# Patient Record
Sex: Male | Born: 1990 | Race: Black or African American | Hispanic: No | Marital: Single | Smoking: Former smoker
Health system: Southern US, Community
[De-identification: ages and names within clinical notes are randomized; demographics above are authoritative.]

---
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
## 2015-03-10 ENCOUNTER — Encounter: Payer: Self-pay | Admitting: Emergency Medicine

## 2015-03-10 ENCOUNTER — Emergency Department: Payer: Self-pay

## 2015-03-10 ENCOUNTER — Emergency Department
Admission: EM | Admit: 2015-03-10 | Discharge: 2015-03-10 | Disposition: A | Discharge status: Home / Self Care | Payer: Self-pay | Attending: Emergency Medicine | Admitting: Emergency Medicine

## 2015-03-10 DIAGNOSIS — F17210 Nicotine dependence, cigarettes, uncomplicated: Secondary | ICD-10-CM | POA: Insufficient documentation

## 2015-03-10 DIAGNOSIS — R002 Palpitations: Secondary | ICD-10-CM

## 2015-03-10 DIAGNOSIS — F1210 Cannabis abuse, uncomplicated: Secondary | ICD-10-CM | POA: Insufficient documentation

## 2015-03-10 DIAGNOSIS — F1010 Alcohol abuse, uncomplicated: Secondary | ICD-10-CM | POA: Insufficient documentation

## 2015-03-10 LAB — URINALYSIS COMPLETE WITH MICROSCOPIC (ARMC ONLY)
BACTERIA UA: NONE SEEN
Bilirubin Urine: NEGATIVE
Glucose, UA: NEGATIVE mg/dL
Hgb urine dipstick: NEGATIVE
Leukocytes, UA: NEGATIVE
Nitrite: NEGATIVE
PROTEIN: NEGATIVE mg/dL
Specific Gravity, Urine: 1.023 (ref 1.005–1.030)
Squamous Epithelial / LPF: NONE SEEN
pH: 6 (ref 5.0–8.0)

## 2015-03-10 LAB — URINE DRUG SCREEN, QUALITATIVE (ARMC ONLY)
Amphetamines, Ur Screen: NOT DETECTED
Barbiturates, Ur Screen: NOT DETECTED
Benzodiazepine, Ur Scrn: NOT DETECTED
CANNABINOID 50 NG, UR ~~LOC~~: POSITIVE — AB
Cocaine Metabolite,Ur ~~LOC~~: NOT DETECTED
MDMA (ECSTASY) UR SCREEN: NOT DETECTED
Methadone Scn, Ur: NOT DETECTED
Opiate, Ur Screen: NOT DETECTED
PHENCYCLIDINE (PCP) UR S: NOT DETECTED
Tricyclic, Ur Screen: NOT DETECTED

## 2015-03-10 LAB — CBC
HCT: 42.2 % (ref 40.0–52.0)
HEMOGLOBIN: 14.3 g/dL (ref 13.0–18.0)
MCH: 29.9 pg (ref 26.0–34.0)
MCHC: 33.8 g/dL (ref 32.0–36.0)
MCV: 88.3 fL (ref 80.0–100.0)
PLATELETS: 199 10*3/uL (ref 150–440)
RBC: 4.78 MIL/uL (ref 4.40–5.90)
RDW: 14.4 % (ref 11.5–14.5)
WBC: 10.7 10*3/uL — ABNORMAL HIGH (ref 3.8–10.6)

## 2015-03-10 LAB — BASIC METABOLIC PANEL
Anion gap: 9 (ref 5–15)
BUN: 15 mg/dL (ref 6–20)
CALCIUM: 9.4 mg/dL (ref 8.9–10.3)
CO2: 27 mmol/L (ref 22–32)
CREATININE: 1.34 mg/dL — AB (ref 0.61–1.24)
Chloride: 106 mmol/L (ref 101–111)
GFR calc Af Amer: >60 mL/min (ref >60)
GFR calc non Af Amer: >60 mL/min (ref >60)
GLUCOSE: 98 mg/dL (ref 65–99)
Potassium: 3.5 mmol/L (ref 3.5–5.1)
Sodium: 142 mmol/L (ref 135–145)

## 2015-03-10 LAB — TROPONIN I

## 2015-03-10 MED ORDER — SODIUM CHLORIDE 0.9 % IV BOLUS (SEPSIS)
1000.0000 mL | Freq: Once | INTRAVENOUS | Status: AC
  Administered 2015-03-10: 1000 mL via INTRAVENOUS

## 2015-03-10 MED ORDER — LORAZEPAM 2 MG/ML IJ SOLN
1.0000 mg | Freq: Once | INTRAMUSCULAR | Status: AC
Start: 2015-03-10 — End: 2015-03-10
  Administered 2015-03-10: 1 mg via INTRAVENOUS
  Filled 2015-03-10: qty 1

## 2015-03-10 NOTE — ED Provider Notes (Signed)
[facility] Emergency Department Provider Note  REMINDER - THIS NOTE IS NOT A FINAL MEDICAL RECORD UNTIL IT IS SIGNED. UNTIL THEN, THE CONTENT BELOW MAY REFLECT INFORMATION FROM A DOCUMENTATION TEMPLATE, NOT THE ACTUAL PATIENT VISIT. ____________________________________________  Time seen: Approximately 4:02 AM  I have reviewed the triage vital signs and the nursing notes.   HISTORY  Chief Complaint Palpitations    HPI Victor Maldonado is a 24 y.o. male presents to the ER for evaluation of palpitation.  Patient reports that for about the last 2-3 weeks he is been experiencing episodes where he feels like his heart will beat hard in his chest at times. He also reports that he's been using a fair amount of marijuana daily for the last 2 weeks, and will occasionally drink a pint of alcohol which she did drink this evening as well as smokes marijuana. He denies any other drug use.  He denies fevers or chills. No chest pain or trouble breathing. He has not passed out, he does not feel weak. He does report that he feels a little bit "shaky" at times. Denies a history of withdrawals.  No nausea or vomiting. No headache. No trouble breathing, though he does report that occasionally he coughs especially with smoking cigarettes.  No history of alcohol withdrawals. He reports that he usually goes and drinks about once a day, and he is not developed the shakes during the day however he does feel shaky tonight.  History reviewed. No pertinent past medical history.  There are no active problems to display for this patient.   History reviewed. No pertinent past surgical history.  No current outpatient prescriptions on file.  Allergies Review of patient's allergies indicates no known allergies.  No family history on file.  Social History Social History  Substance Use Topics  . Smoking status: Current Every Day Smoker -- 0.50 packs/day    Types: Cigarettes  .  Smokeless tobacco: None  . Alcohol Use: Yes    Review of Systems Constitutional: No fever/chills Eyes: No visual changes. ENT: No sore throat. Cardiovascular: Denies chest pain. Respiratory: Denies shortness of breath. Occasional dry cough, especially after smoking. Gastrointestinal: No abdominal pain.  No nausea, no vomiting.  No diarrhea.  No constipation. Genitourinary: Negative for dysuria. Musculoskeletal: Negative for back pain. Skin: Negative for rash. Neurological: Negative for headaches, focal weakness or numbness. Feels slightly tremulous at times. Denies hallucinations, paranoia. No desire to harm himself or anyone else.   10-point ROS otherwise negative.  ____________________________________________   PHYSICAL EXAM:  VITAL SIGNS: ED Triage Vitals  Enc Vitals Group     BP 03/10/15 0257 161/88 mmHg     Pulse Rate 03/10/15 0257 91     Resp 03/10/15 0257 24     Temp 03/10/15 0257 98.4 F (36.9 C)     Temp Source 03/10/15 0257 Oral     SpO2 03/10/15 0257 99 %     Weight 03/10/15 0257 155 lb (70.308 kg)     Height 03/10/15 0257  (1.93 m)     Head Cir --      Peak Flow --      Pain Score --      Pain Loc --      Pain Edu? --      Excl. in GC? --    Constitutional: Alert and oriented. Well appearing and in no acute distress. Patient reports he feels slightly anxious. Eyes: Conjunctivae are normal. PERRL. EOMI. Head: Atraumatic. Nose:  No congestion/rhinnorhea. Mouth/Throat: Mucous membranes are moist.  Oropharynx non-erythematous. Neck: No stridor.   Cardiovascular: Normal rate, regular rhythm. Grossly normal heart sounds.  Good peripheral circulation. Respiratory: Normal respiratory effort.  No retractions. Lungs CTAB. Gastrointestinal: Soft and nontender. No distention. No abdominal bruits. No CVA tenderness. Musculoskeletal: No lower extremity tenderness nor edema.  No joint effusions. Neurologic:  Normal speech and language. No gross focal neurologic  deficits are appreciated. No gait instability. Skin:  Skin is warm, dry and intact. No rash noted. Psychiatric: Mood and affect are slightly anxious. Speech and behavior are normal.  ____________________________________________   LABS (all labs ordered are listed, but only abnormal results are displayed)  Labs Reviewed  BASIC METABOLIC PANEL - Abnormal; Notable for the following:    Creatinine, Ser 1.34 (*)    All other components within normal limits  CBC - Abnormal; Notable for the following:    WBC 10.7 (*)    All other components within normal limits  URINE DRUG SCREEN, QUALITATIVE ([facility] ONLY) - Abnormal; Notable for the following:    Cannabinoid 50 Ng, Ur [place] POSITIVE (*)    All other components within normal limits  URINALYSIS COMPLETEWITH MICROSCOPIC ([facility] ONLY) - Abnormal; Notable for the following:    Color, Urine YELLOW (*)    APPearance CLEAR (*)    Ketones, ur TRACE (*)    All other components within normal limits  TROPONIN I  ETHANOL   ____________________________________________  EKG  ED ECG REPORT I, QUALE, MARK, the attending physician, personally viewed and interpreted this ECG.  Date: 03/10/2015 EKG Time: 255 Rate: 90 Rhythm: normal sinus rhythm QRS Axis: normal Intervals: normal ST/T Wave abnormalities: normal Conduction Disutrbances: Slight right axis deviation Narrative Interpretation: Right axis Normal QTC, no Brugada, no WPW, no evidence of ischemia ____________________________________________  RADIOLOGY  DG Chest 2 View (Final result) Result time: 03/10/15 03:36:38   Final result by Rad Results In Interface (03/10/15 03:36:38)   Narrative:   CLINICAL DATA: Lightheaded. Rapid heart beat. Chest pain for 3 weeks. Smoker.  EXAM: CHEST 2 VIEW  COMPARISON: None.  FINDINGS: The heart size and mediastinal contours are within normal limits. Both lungs are clear. The visualized skeletal structures are unremarkable.  IMPRESSION: No  active cardiopulmonary disease.    ____________________________________________   PROCEDURES  Procedure(s) performed: None  Critical Care performed: No  ____________________________________________   INITIAL IMPRESSION / ASSESSMENT AND PLAN / ED COURSE  Pertinent labs & imaging results that were available during my care of the patient were reviewed by me and considered in my medical decision making (see chart for details).  Patient presents for evaluation of palpitations. This is in the setting of using marijuana and drinking alcohol tonight, and he has no acute cardiopulmonary symptoms other than occasional dry cough. He reports to me that he is eating lightly today, he drank fairly heavily this evening, and also use marijuana. He does appear slightly anxious.  EKG is reassuring. No chest pain. No syncope. Discussed with the patient, and will obtain labs to evaluate a left relates the setting of his alcohol use, also check his hemoglobin, and a chest x-ray. We will observe him, and we'll provide a small dose of Ativan as he does appear to be anxious. He denies a history of heavy regular alcohol use, he has no history of prior withdrawals or seizures. He demonstrates no hyperreflexia or signs of alcohol withdrawal at this time. His aching and thought processes are clear.  We will hydrate him and  his creatinine is slightly elevated, GFR is normal. In addition provide Ativan and observe him closely. I had a frank discussion with him about alcohol use, and heavy drinking along with regular marijuana use and some of the risks of this. He is agreeable to decrease alcohol use, and also plans to stop smoking cigarettes and decreased marijuana use. He is not driving tonight and does not drive or use alcohol or marijuana.  ----------------------------------------- 5:49 AM on 03/10/2015 -----------------------------------------  Patient resting comfortably. He awakens to voice, sits up reports  that all symptoms have gone away. Discussed careful return precautions, advised follow-up with [facility] regarding substance and treatment. He is not driving himself home. Alert, no distress.  Discussed careful return precautions. ____________________________________________   FINAL CLINICAL IMPRESSION(S) / ED DIAGNOSES  Final diagnoses:  Mild tetrahydrocannabinol (THC) abuse  Alcohol abuse  Palpitation      Sharyn CreamerMark Quale, MD 03/10/15 [phone]

## 2015-03-10 NOTE — Discharge Instructions (Signed)
Cannabis Use Disorder Cannabis use disorder is a mental disorder. It is not one-time or occasional use of cannabis, more commonly known as marijuana. Cannabis use disorder is the continued, nonmedical use of cannabis that interferes with normal life activities or causes health problems. People with cannabis use disorder get a feeling of extreme pleasure and relaxation from cannabis use. This "high" is very rewarding and causes people to use over and over.  The mind-altering ingredient in cannabis is know as THC. THC can also interfere with motor coordination, memory, judgment, and accurate sense of space and time. These effects can last for a few days after using cannabis. Regular heavy cannabis use can cause long-lasting problems with thinking and learning. In young people, these problems may be permanent. Cannabis sometimes causes severe anxiety, paranoia, or visual hallucinations. Man-made (synthetic) cannabis-like drugs, such as "spice" and "K2," cause the same effects as THC but are much stronger. Cannabis-like drugs can cause dangerously high blood pressure and heart rate.  Cannabis use disorder usually starts in the teenage years. It can trigger the development of schizophrenia. It is somewhat more common in men than women. People who have family members with the disorder or existing mental health issues such as depression and posttraumatic stress disorderare more likely to develop cannabis use disorder. People with cannabis use disorder are at higher risk for use of other drugs of abuse.  SIGNS AND SYMPTOMS Signs and symptoms of cannabis use disorder include:   Use of cannabis in larger amounts or over a longer period than intended.   Unsuccessful attempts to cut down or control cannabis use.   A lot of time spent obtaining, using, or recovering from the effects of cannabis.   A strong desire or urge to use cannabis (cravings).   Continued use of cannabis in spite of problems at work,  school, or home because of use.   Continued use of cannabis in spite of relationship problems because of use.  Giving up or cutting down on important life activities because of cannabis use.  Use of cannabis over and over even in situations when it is physically hazardous, such as when driving a car.   Continued use of cannabis in spite of a physical problem that is likely related to use. Physical problems can include:  Chronic cough.  Bronchitis.  Emphysema.  Throat and lung cancer.  Continued use of cannabis in spite of a mental problem that is likely related to use. Mental problems can include:  Psychosis.  Anxiety.  Difficulty sleeping.  Need to use more and more cannabis to get the same effect, or lessened effect over time with use of the same amount (tolerance).  Having withdrawal symptoms when cannabis use is stopped, or using cannabis to reduce or avoid withdrawal symptoms. Withdrawal symptoms include:  Irritability or anger.  Anxiety or restlessness.  Difficulty sleeping.  Loss of appetite or weight.  Aches and pains.  Shakiness.  Sweating.  Chills. DIAGNOSIS Cannabis use disorder is diagnosed by your health care provider. You may be asked questions about your cannabis use and how it affects your life. A physical exam may be done. A drug screen may be done. You may be referred to a mental health professional. The diagnosis of cannabis use disorder requires at least two symptoms within 12 months. The type of cannabis use disorder you have depends on the number of symptoms you have. The type may be:  Mild. Two or three signs and symptoms.   Moderate. Four or   five signs and symptoms.   Severe. Six or more signs and symptoms.  TREATMENT Treatment is usually provided by mental health professionals with training in substance use disorders. The following options are available:  Counseling or talk therapy. Talk therapy addresses the reasons you use  cannabis. It also addresses ways to keep you from using again. The goals of talk therapy include:  Identifying and avoiding triggers for use.  Learning how to handle cravings.  Replacing use with healthy activities.  Support groups. Support groups provide emotional support, advice, and guidance.  Medicine. Medicine is used to treat mental health issues that trigger cannabis use or that result from it. HOME CARE INSTRUCTIONS  Take medicines only as directed by your health care provider.  Check with your health care provider before starting any new medicines.  Keep all follow-up visits as directed by your health care provider. SEEK MEDICAL CARE IF:  You are not able to take your medicines as directed.  Your symptoms get worse. SEEK IMMEDIATE MEDICAL CARE IF: You have serious thoughts about hurting yourself or others. FOR MORE INFORMATION  National Institute on Drug Abuse: http://www.price-smith.com/  Substance Abuse and Mental Health Services Administration: SkateOasis.com.pt   This information is not intended to replace advice given to you by your health care provider. Make sure you discuss any questions you have with your health care provider.   Document Released: 03/31/2000 Document Revised: 04/24/2014 Document Reviewed: 04/16/2013 Elsevier Interactive Patient Education 2016 [employer].  Palpitations A palpitation is the feeling that your heartbeat is irregular or is faster than normal. It may feel like your heart is fluttering or skipping a beat. Palpitations are usually not a serious problem. However, in some cases, you may need further medical evaluation. CAUSES  Palpitations can be caused by:  Smoking.  Caffeine or other stimulants, such as diet pills or energy drinks.  Alcohol.  Stress and anxiety.  Strenuous physical activity.  Fatigue.  Certain medicines.  Heart disease, especially if you have a history of irregular heart rhythms (arrhythmias), such as atrial  fibrillation, atrial flutter, or supraventricular tachycardia.  An improperly working pacemaker or defibrillator. DIAGNOSIS  To find the cause of your palpitations, your health care provider will take your medical history and perform a physical exam. Your health care provider may also have you take a test called an ambulatory electrocardiogram (ECG). An ECG records your heartbeat patterns over a 24-hour period. You may also have other tests, such as:  Transthoracic echocardiogram (TTE). During echocardiography, sound waves are used to evaluate how blood flows through your heart.  Transesophageal echocardiogram (TEE).  Cardiac monitoring. This allows your health care provider to monitor your heart rate and rhythm in real time.  Holter monitor. This is a portable device that records your heartbeat and can help diagnose heart arrhythmias. It allows your health care provider to track your heart activity for several days, if needed.  Stress tests by exercise or by giving medicine that makes the heart beat faster. TREATMENT  Treatment of palpitations depends on the cause of your symptoms and can vary greatly. Most cases of palpitations do not require any treatment other than time, relaxation, and monitoring your symptoms. Other causes, such as atrial fibrillation, atrial flutter, or supraventricular tachycardia, usually require further treatment. HOME CARE INSTRUCTIONS   Avoid:  Caffeinated coffee, tea, soft drinks, diet pills, and energy drinks.  Chocolate.  Alcohol.  Stop smoking if you smoke.  Reduce your stress and anxiety. Things that can help you relax  include:  A method of controlling things in your body, such as your heartbeats, with your mind (biofeedback).  Yoga.  Meditation.  Physical activity such as swimming, jogging, or walking.  Get plenty of rest and sleep. SEEK MEDICAL CARE IF:   You continue to have a fast or irregular heartbeat beyond 24 hours.  Your  palpitations occur more often. SEEK IMMEDIATE MEDICAL CARE IF:  You have chest pain or shortness of breath.  You have a severe headache.  You feel dizzy or you faint. MAKE SURE YOU:  Understand these instructions.  Will watch your condition.  Will get help right away if you are not doing well or get worse.   This information is not intended to replace advice given to you by your health care provider. Make sure you discuss any questions you have with your health care provider.   Document Released: 03/31/2000 Document Revised: 04/08/2013 Document Reviewed: 06/02/2011 Elsevier Interactive Patient Education 2016 [employer]  Alcohol Abuse and Nutrition Alcohol abuse is any pattern of alcohol consumption that harms your health, relationships, or work. Alcohol abuse can affect how your body breaks down and absorbs nutrients from food by causing your liver to work abnormally. Additionally, many people who abuse alcohol do not eat enough carbohydrates, protein, fat, vitamins, and minerals. This can cause poor nutrition (malnutrition) and a lack of nutrients (nutrient deficiencies), which can lead to further complications. Nutrients that are commonly lacking (deficient) among people who abuse alcohol include:  Vitamins.  Vitamin A. This is stored in your liver. It is important for your vision, metabolism, and ability to fight off infections (immunity).  B vitamins. These include vitamins such as folate, thiamin, and niacin. These are important in new cell growth and maintenance.  Vitamin C. This plays an important role in iron absorption, wound healing, and immunity.  Vitamin D. This is produced by your liver, but you can also get vitamin D from food. Vitamin D is necessary for your body to absorb and use calcium.  Minerals.  Calcium. This is important for your bones and your heart and blood vessel (cardiovascular) function.  Iron. This is important for blood, muscle, and nervous  system functioning.  Magnesium. This plays an important role in muscle and nerve function, and it helps to control blood sugar and blood pressure.  Zinc. This is important for the normal function of your nervous system and digestive system (gastrointestinal tract). Nutrition is an essential component of therapy for alcohol abuse. Your health care provider or dietitian will work with you to design a plan that can help restore nutrients to your body and prevent potential complications. WHAT IS MY PLAN? Your dietitian may develop a specific diet plan that is based on your condition and any other complications you may have. A diet plan will commonly include:  A balanced diet.  Grains: 6-8 oz per day.  Vegetables: 2-3 cups per day.  Fruits: 1-2 cups per day.  Meat and other protein: 5-6 oz per day.  Dairy: 2-3 cups per day.  Vitamin and mineral supplements. WHAT DO I NEED TO KNOW ABOUT ALCOHOL AND NUTRITION?  Consume foods that are high in antioxidants, such as grapes, berries, nuts, green tea, and dark green and orange vegetables. This can help to counteract some of the stress that is placed on your liver by consuming alcohol.  Avoid food and drinks that are high in fat and sugar. Foods such as sugared soft drinks, salty snack foods, and candy contain empty calories.  This means that they lack important nutrients such as protein, fiber, and vitamins.  Eat frequent meals and snacks. Try to eat 5-6 small meals each day.  Eat a variety of fresh fruits and vegetables each day. This will help you get plenty of water, fiber, and vitamins in your diet.  Drink plenty of water and other clear fluids. Try to drink at least 48-64 oz (1.5-2 L) of water per day.  If you are a vegetarian, eat a variety of protein-rich foods. Pair whole grains with plant-based proteins at meals and snacks to obtain the greatest nutrient benefit from your food. For example, eat rice with beans, put peanut butter on  whole-grain toast, or eat oatmeal with sunflower seeds.  Soak beans and whole grains overnight before cooking. This can help your body to absorb the nutrients more easily.  Include foods fortified with vitamins and minerals in your diet. Commonly fortified foods include milk, orange juice, cereal, and bread.  If you are malnourished, your dietitian may recommend a high-protein, high-calorie diet. This may include:  2,000-3,000 calories (kilocalories) per day.  70-100 grams of protein per day.  Your health care provider may recommend a complete nutritional supplement beverage. This can help to restore calories, protein, and vitamins to your body. Depending on your condition, you may be advised to consume this instead of or in addition to meals.  Limit your intake of caffeine. Replace drinks like coffee and black tea with decaffeinated coffee and herbal tea.  Eat a variety of foods that are high in omega fatty acids. These include fish, nuts and seeds, and soybeans. These foods may help your liver to recover and may also stabilize your mood.  Certain medicines may cause changes in your appetite, taste, and weight. Work with your health care provider and dietitian to make any adjustments to your medicines and diet plan.  Include other healthy lifestyle choices in your daily routine.  Be physically active.  Get enough sleep.  Spend time doing activities that you enjoy.  If you are unable to take in enough food and calories by mouth, your health care provider may recommend a feeding tube. This is a tube that passes through your nose and throat, directly into your stomach. Nutritional supplement beverages can be given to you through the feeding tube to help you get the nutrients you need.  Take vitamin or mineral supplements as recommended by your health care provider. WHAT FOODS CAN I EAT? Grains Enriched pasta. Enriched rice. Fortified whole-grain bread. Fortified whole-grain cereal.  Barley. Brown rice. Quinoa. Millet. Vegetables All fresh, frozen, and canned vegetables. Spinach. Kale. Artichoke. Carrots. Winter squash and pumpkin. Sweet potatoes. Broccoli. Cabbage. Cucumbers. Tomatoes. Sweet peppers. Green beans. Peas. Corn. Fruits All fresh and frozen fruits. Berries. Grapes. Mango. Papaya. Guava. Cherries. Apples. Bananas. Peaches. Plums. Pineapple. Watermelon. Cantaloupe. Oranges. Avocado. Meats and Other Protein Sources Beef liver. Lean beef. Pork. Fresh and canned chicken. Fresh fish. Oysters. Sardines. Canned tuna. Shrimp. Eggs with yolks. Nuts and seeds. Peanut butter. Beans and lentils. Soybeans. Tofu. Dairy Whole, low-fat, and nonfat milk. Whole, low-fat, and nonfat yogurt. Cottage cheese. Sour cream. Hard and soft cheeses. Beverages Water. Herbal tea. Decaffeinated coffee. Decaffeinated green tea. 100% fruit juice. 100% vegetable juice. Instant breakfast shakes. Condiments Ketchup. Mayonnaise. Mustard. Salad dressing. Barbecue sauce. Sweets and Desserts Sugar-free ice cream. Sugar-free pudding. Sugar-free gelatin. Fats and Oils Butter. Vegetable oil, flaxseed oil, olive oil, and walnut oil. Other Complete nutrition shakes. Protein bars. Sugar-free gum. The items listed  above may not be a complete list of recommended foods or beverages. Contact your dietitian for more options. WHAT FOODS ARE NOT RECOMMENDED? Grains Sugar-sweetened breakfast cereals. Flavored instant oatmeal. Fried breads. Vegetables Breaded or deep-fried vegetables. Fruits Dried fruit with added sugar. Candied fruit. Canned fruit in syrup. Meats and Other Protein Sources Breaded or deep-fried meats. Dairy Flavored milks. Fried cheese curds or fried cheese sticks. Beverages Alcohol. Sugar-sweetened soft drinks. Sugar-sweetened tea. Caffeinated coffee and tea. Condiments Sugar. Honey. Agave nectar. Molasses. Sweets and Desserts Chocolate. Cake. Cookies. Candy. Other Potato chips.  Pretzels. Salted nuts. Candied nuts. The items listed above may not be a complete list of foods and beverages to avoid. Contact your dietitian for more information.   This information is not intended to replace advice given to you by your health care provider. Make sure you discuss any questions you have with your health care provider.   Document Released: 01/26/2005 Document Revised: 04/24/2014 Document Reviewed: 11/04/2013 Elsevier Interactive Patient Education [employer]

## 2015-03-10 NOTE — ED Notes (Signed)
Pt to triage via w/c with no distress noted; pt reports intermittent palpitations last few weeks; admits to ETOH and marijuana use; denies any accomp symptoms; denies hx of same

## 2016-07-11 IMAGING — CR DG CHEST 2V
1 series · 2 of 2 positions shown · non-contrast
Comparison: None.

CLINICAL DATA: Lightheaded. Rapid heart beat. Chest pain for 3
weeks. Smoker.

EXAM:
CHEST  2 VIEW

[Series 1: w chest pa · 0.14mm/px · 2 of 2 slices shown]
[im 1/2]
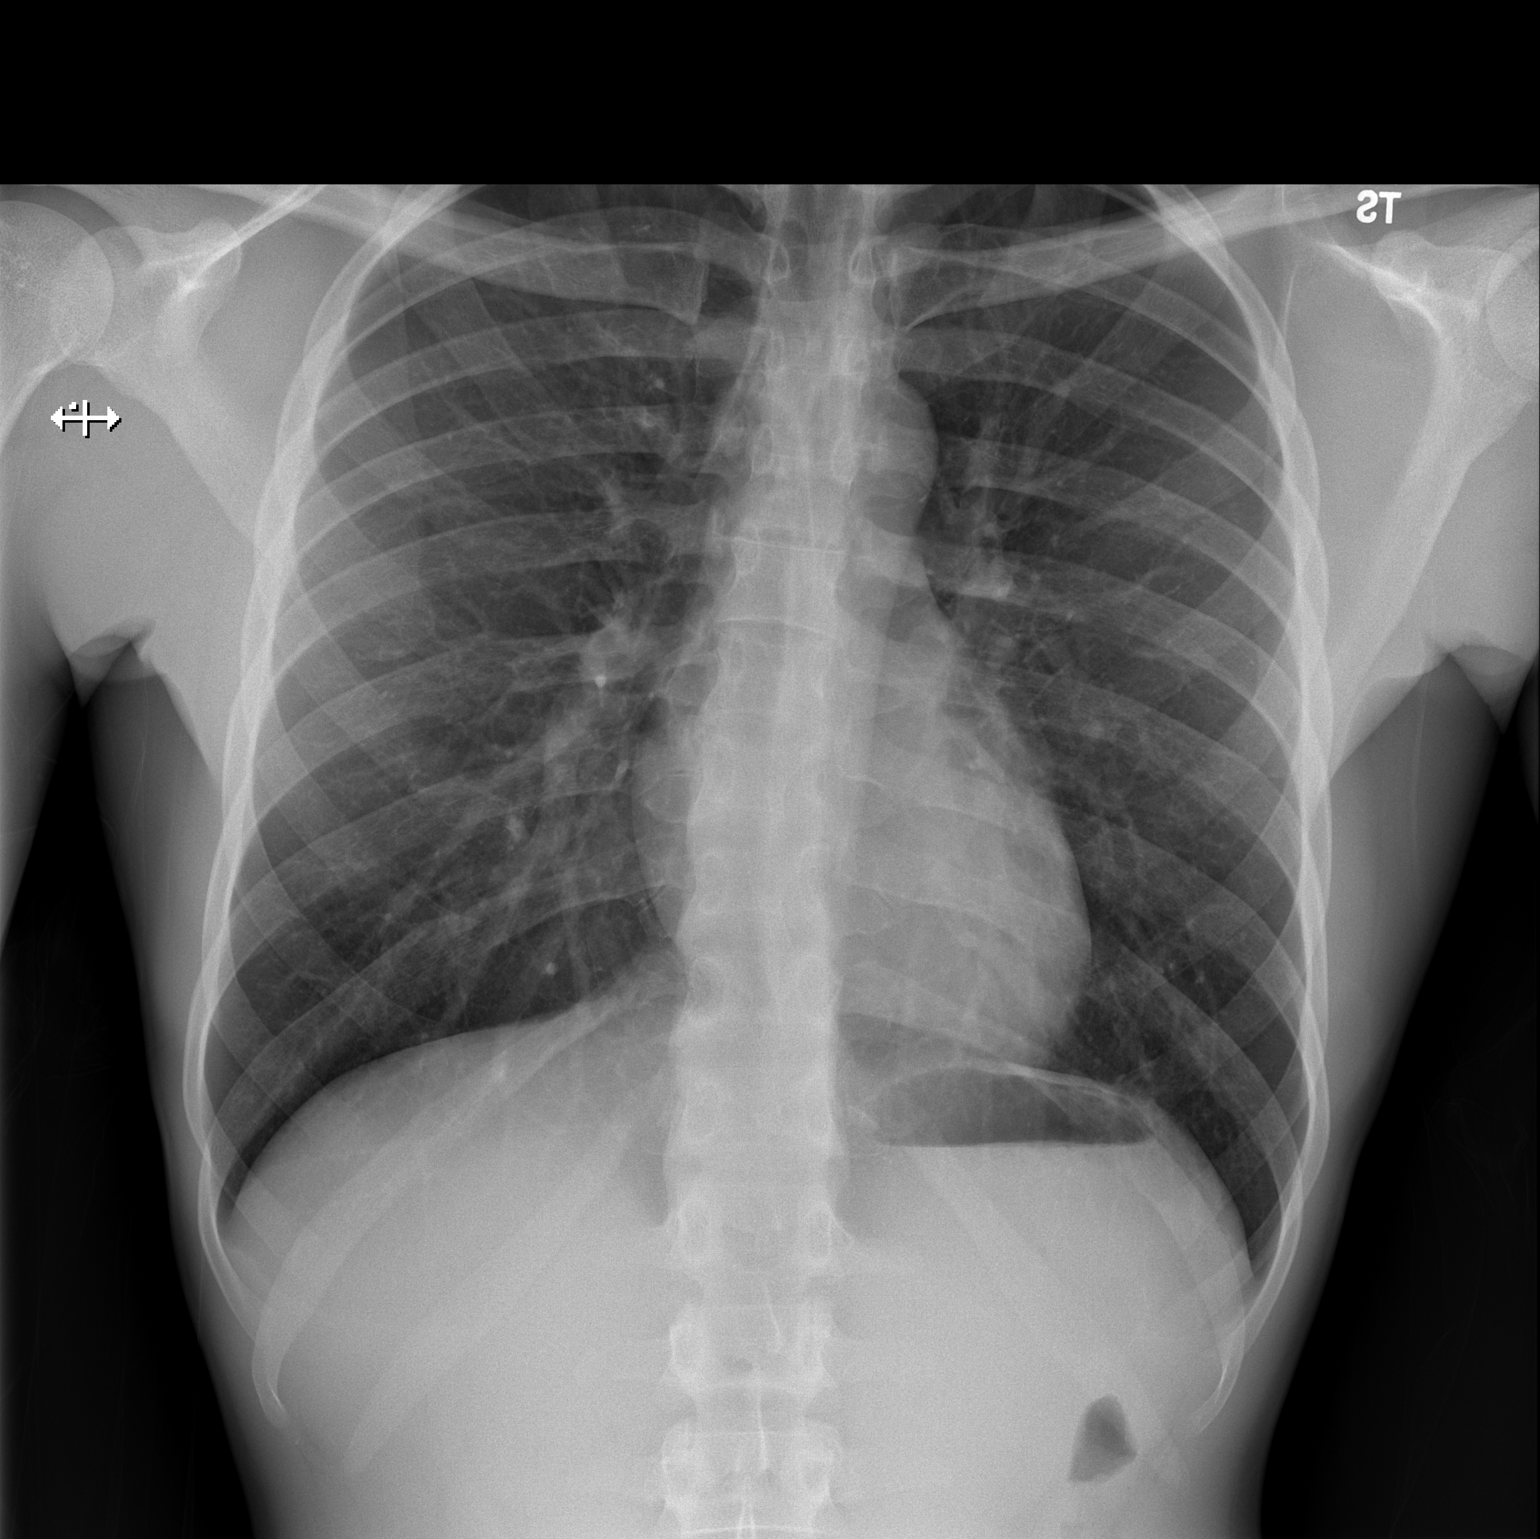
[im 2/2]
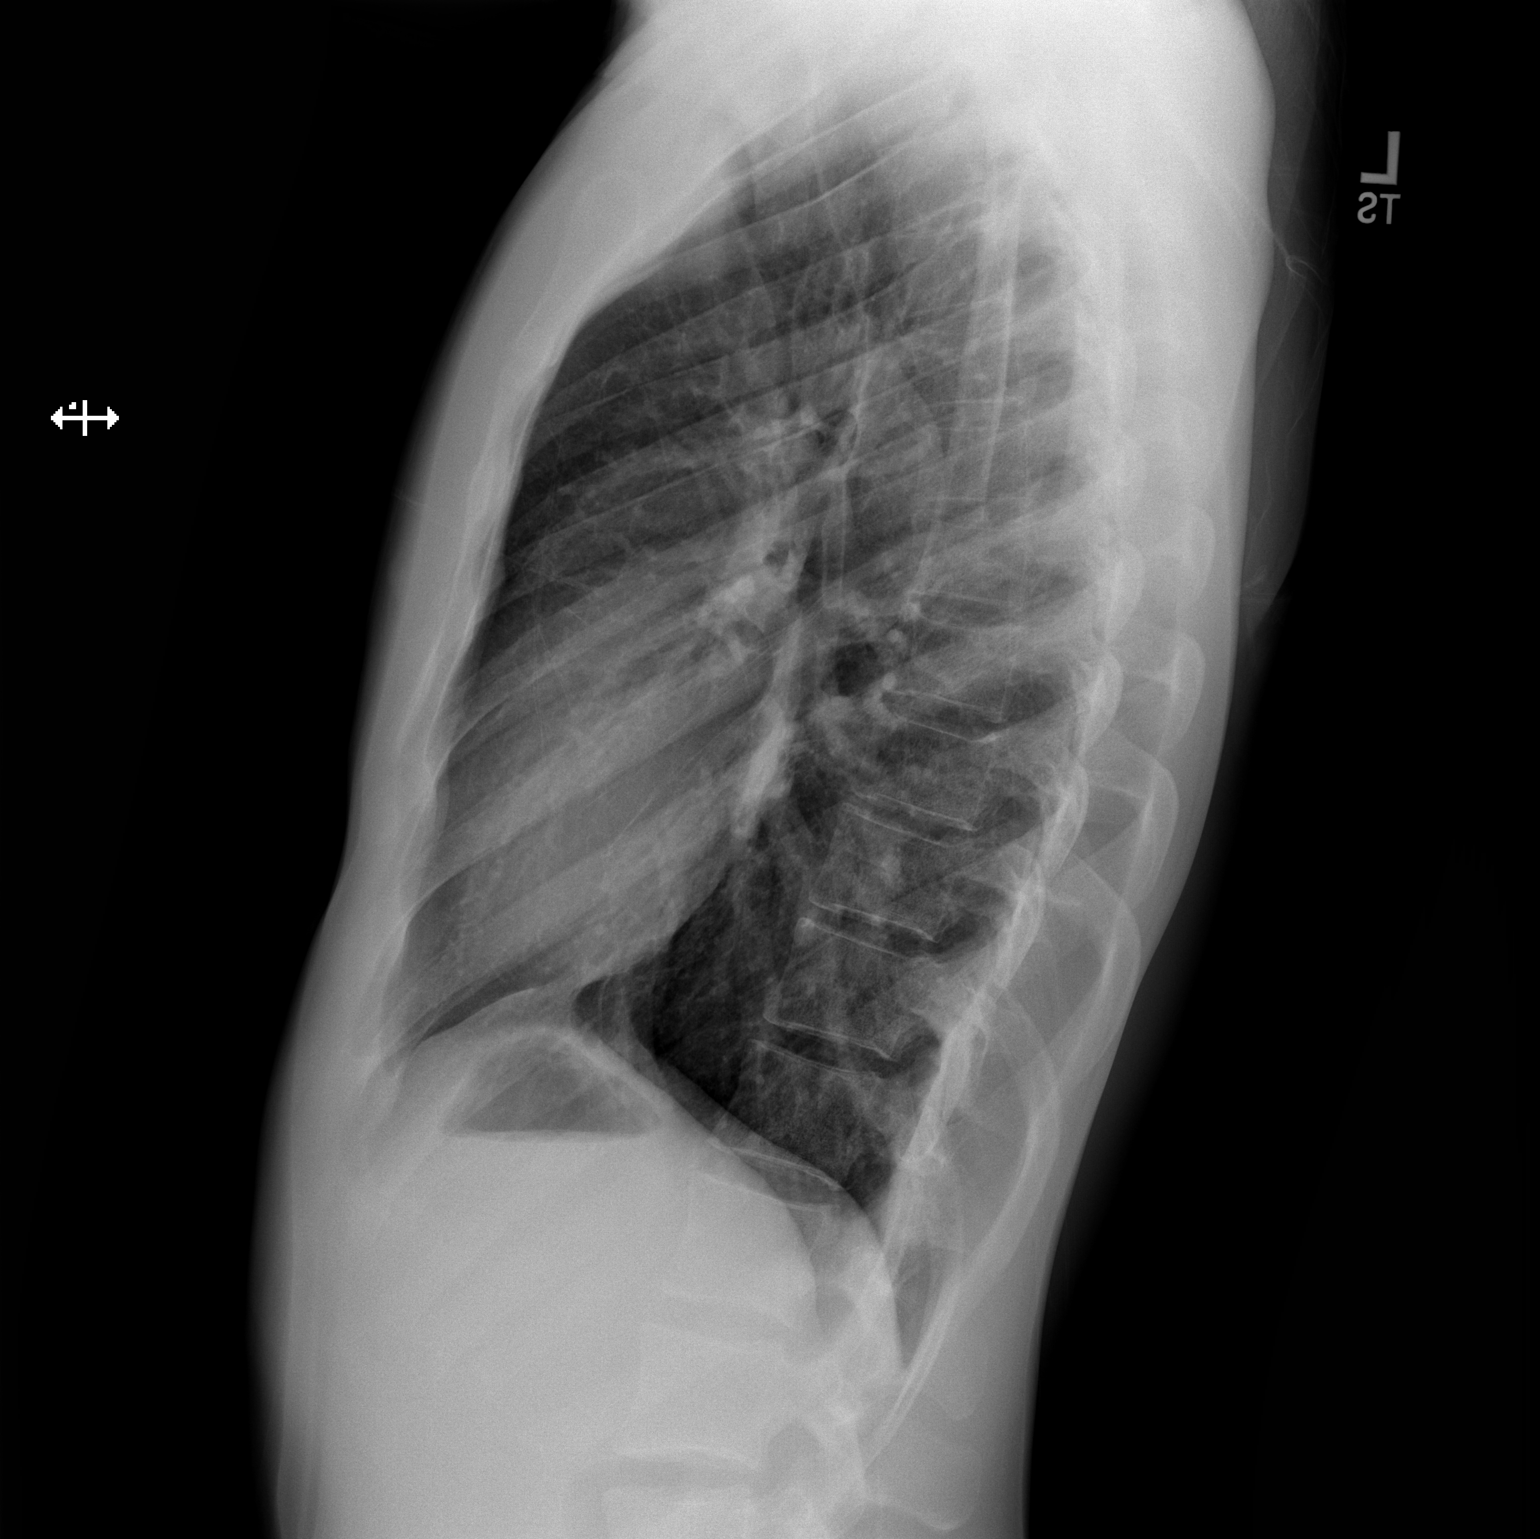

[2 of 2 positions shown; findings below may reference images not displayed]

FINDINGS: The heart size and mediastinal contours are within normal limits.
Both lungs are clear. The visualized skeletal structures are
unremarkable.
IMPRESSION: No active cardiopulmonary disease.

## 2018-04-29 DIAGNOSIS — R6889 Other general symptoms and signs: Secondary | ICD-10-CM | POA: Diagnosis not present

## 2018-04-29 DIAGNOSIS — J101 Influenza due to other identified influenza virus with other respiratory manifestations: Secondary | ICD-10-CM | POA: Diagnosis not present

## 2018-05-17 DIAGNOSIS — F4323 Adjustment disorder with mixed anxiety and depressed mood: Secondary | ICD-10-CM | POA: Diagnosis not present

## 2018-07-11 DIAGNOSIS — F4323 Adjustment disorder with mixed anxiety and depressed mood: Secondary | ICD-10-CM | POA: Diagnosis not present

## 2019-06-18 DIAGNOSIS — F4323 Adjustment disorder with mixed anxiety and depressed mood: Secondary | ICD-10-CM | POA: Diagnosis not present

## 2019-10-29 DIAGNOSIS — F4310 Post-traumatic stress disorder, unspecified: Secondary | ICD-10-CM | POA: Diagnosis not present

## 2019-11-13 DIAGNOSIS — F4310 Post-traumatic stress disorder, unspecified: Secondary | ICD-10-CM | POA: Diagnosis not present

## 2019-11-25 DIAGNOSIS — F4310 Post-traumatic stress disorder, unspecified: Secondary | ICD-10-CM | POA: Diagnosis not present

## 2019-12-09 DIAGNOSIS — F4310 Post-traumatic stress disorder, unspecified: Secondary | ICD-10-CM | POA: Diagnosis not present

## 2019-12-16 DIAGNOSIS — F4310 Post-traumatic stress disorder, unspecified: Secondary | ICD-10-CM | POA: Diagnosis not present

## 2020-01-27 DIAGNOSIS — F4310 Post-traumatic stress disorder, unspecified: Secondary | ICD-10-CM | POA: Diagnosis not present

## 2020-02-10 DIAGNOSIS — F4310 Post-traumatic stress disorder, unspecified: Secondary | ICD-10-CM | POA: Diagnosis not present

## 2020-02-24 DIAGNOSIS — F4310 Post-traumatic stress disorder, unspecified: Secondary | ICD-10-CM | POA: Diagnosis not present

## 2020-03-23 DIAGNOSIS — F4310 Post-traumatic stress disorder, unspecified: Secondary | ICD-10-CM | POA: Diagnosis not present

## 2020-03-23 DIAGNOSIS — R21 Rash and other nonspecific skin eruption: Secondary | ICD-10-CM | POA: Diagnosis not present

## 2020-03-23 DIAGNOSIS — J029 Acute pharyngitis, unspecified: Secondary | ICD-10-CM | POA: Diagnosis not present

## 2020-03-23 DIAGNOSIS — T50995A Adverse effect of other drugs, medicaments and biological substances, initial encounter: Secondary | ICD-10-CM | POA: Diagnosis not present

## 2020-03-26 DIAGNOSIS — L600 Ingrowing nail: Secondary | ICD-10-CM | POA: Diagnosis not present

## 2020-03-26 DIAGNOSIS — B351 Tinea unguium: Secondary | ICD-10-CM | POA: Diagnosis not present

## 2020-03-26 DIAGNOSIS — M7989 Other specified soft tissue disorders: Secondary | ICD-10-CM | POA: Diagnosis not present

## 2020-03-26 DIAGNOSIS — D492 Neoplasm of unspecified behavior of bone, soft tissue, and skin: Secondary | ICD-10-CM | POA: Diagnosis not present

## 2020-03-30 DIAGNOSIS — F4310 Post-traumatic stress disorder, unspecified: Secondary | ICD-10-CM | POA: Diagnosis not present

## 2020-04-13 DIAGNOSIS — F4310 Post-traumatic stress disorder, unspecified: Secondary | ICD-10-CM | POA: Diagnosis not present

## 2020-04-20 DIAGNOSIS — F4310 Post-traumatic stress disorder, unspecified: Secondary | ICD-10-CM | POA: Diagnosis not present

## 2020-04-27 DIAGNOSIS — F4310 Post-traumatic stress disorder, unspecified: Secondary | ICD-10-CM | POA: Diagnosis not present

## 2020-05-11 ENCOUNTER — Other Ambulatory Visit: Payer: Self-pay

## 2020-05-11 ENCOUNTER — Ambulatory Visit: Payer: Medicaid Other | Admitting: Physician Assistant

## 2020-05-11 DIAGNOSIS — Z113 Encounter for screening for infections with a predominantly sexual mode of transmission: Secondary | ICD-10-CM

## 2020-05-11 DIAGNOSIS — Z299 Encounter for prophylactic measures, unspecified: Secondary | ICD-10-CM | POA: Diagnosis not present

## 2020-05-11 DIAGNOSIS — F4310 Post-traumatic stress disorder, unspecified: Secondary | ICD-10-CM | POA: Diagnosis not present

## 2020-05-12 ENCOUNTER — Encounter: Payer: Self-pay | Admitting: Physician Assistant

## 2020-05-12 MED ORDER — METRONIDAZOLE 500 MG PO TABS: 2000.0000 mg | ORAL_TABLET | Freq: Once | ORAL | 0 refills | Status: AC

## 2020-05-12 NOTE — Progress Notes (Signed)
   [facility] Department STI clinic/screening visit  Subjective:  Victor Maldonado is a 30 y.o. male being seen today for an STI screening visit. The patient reports they do not have symptoms.    Patient has the following medical conditions:  There are no problems to display for this patient.    Chief Complaint  Patient presents with  . SEXUALLY TRANSMITTED DISEASE    screening    HPI  Patient reports that his partner tested positive for Trich and they were both treated but they had sex less than 7 days after taking the medicine and he would like to be retreated.  States last HIV test was 1 year ago and last void was about 2 hr ago.   See flowsheet for further details and programmatic requirements.    The following portions of the patient's history were reviewed and updated as appropriate: allergies, current medications, past medical history, past social history, past surgical history and problem list.  Objective:  There were no vitals filed for this visit.  Physical Exam Constitutional:      General: He is not in acute distress.    Appearance: Normal appearance.  HENT:     Head: Normocephalic and atraumatic.  Eyes:     Conjunctiva/sclera: Conjunctivae normal.  Pulmonary:     Effort: Pulmonary effort is normal.  Skin:    General: Skin is warm and dry.  Neurological:     Mental Status: He is alert and oriented to person, place, and time.  Psychiatric:        Mood and Affect: Mood normal.        Behavior: Behavior normal.        Thought Content: Thought content normal.        Judgment: Judgment normal.       Assessment and Plan:  Darragh AWAB ABEBE is a 30 y.o. male presenting to the [facility] Department for STI screening  1. Screening for STD (sexually transmitted disease) Patient into clinic without symptoms. Patient declines screening and requests re-treatment for Trich only. Rec condoms with all sex.   2. Prophylactic measure Will  treat for possible re-exposure to Trich with Metronidazole 2 go po at one time with food, no EtOH for 24 hr before and until 72 hr after taking medicine. No sex for 10 days and until after partner completes treatment again. Call with questions or concerns or if vomits < 2 hr after taking medicine. - metroNIDAZOLE (FLAGYL) 500 MG tablet; Take 4 tablets (2,000 mg total) by mouth once for 1 dose.  Dispense: 4 tablet; Refill: 0     No follow-ups on file.  No future appointments.  Matt Holmes, PA

## 2020-05-18 DIAGNOSIS — F4310 Post-traumatic stress disorder, unspecified: Secondary | ICD-10-CM | POA: Diagnosis not present

## 2020-05-22 NOTE — Progress Notes (Signed)
Chart reviewed by Pharmacist  Suzanne Walker PharmD, Contract Pharmacist at [facility]  

## 2020-05-25 DIAGNOSIS — F4310 Post-traumatic stress disorder, unspecified: Secondary | ICD-10-CM | POA: Diagnosis not present

## 2020-06-08 DIAGNOSIS — F4310 Post-traumatic stress disorder, unspecified: Secondary | ICD-10-CM | POA: Diagnosis not present

## 2020-07-20 DIAGNOSIS — Z03818 Encounter for observation for suspected exposure to other biological agents ruled out: Secondary | ICD-10-CM | POA: Diagnosis not present

## 2020-07-20 DIAGNOSIS — Z20822 Contact with and (suspected) exposure to covid-19: Secondary | ICD-10-CM | POA: Diagnosis not present

## 2020-11-22 DIAGNOSIS — N410 Acute prostatitis: Secondary | ICD-10-CM | POA: Diagnosis not present

## 2020-11-22 DIAGNOSIS — R102 Pelvic and perineal pain: Secondary | ICD-10-CM | POA: Diagnosis not present

## 2021-01-24 DIAGNOSIS — Z87898 Personal history of other specified conditions: Secondary | ICD-10-CM | POA: Diagnosis not present

## 2021-01-24 DIAGNOSIS — J329 Chronic sinusitis, unspecified: Secondary | ICD-10-CM | POA: Diagnosis not present

## 2021-01-24 DIAGNOSIS — F419 Anxiety disorder, unspecified: Secondary | ICD-10-CM | POA: Diagnosis not present

## 2021-01-24 DIAGNOSIS — Z03818 Encounter for observation for suspected exposure to other biological agents ruled out: Secondary | ICD-10-CM | POA: Diagnosis not present

## 2022-03-01 DIAGNOSIS — M76892 Other specified enthesopathies of left lower limb, excluding foot: Secondary | ICD-10-CM | POA: Diagnosis not present

## 2022-07-17 ENCOUNTER — Emergency Department

## 2022-07-17 ENCOUNTER — Emergency Department
Admission: EM | Admit: 2022-07-17 | Discharge: 2022-07-17 | Disposition: A | Discharge status: Home / Self Care | Attending: Emergency Medicine | Admitting: Emergency Medicine

## 2022-07-17 ENCOUNTER — Other Ambulatory Visit: Payer: Self-pay

## 2022-07-17 DIAGNOSIS — S82832A Other fracture of upper and lower end of left fibula, initial encounter for closed fracture: Secondary | ICD-10-CM | POA: Diagnosis not present

## 2022-07-17 DIAGNOSIS — Z23 Encounter for immunization: Secondary | ICD-10-CM | POA: Diagnosis not present

## 2022-07-17 DIAGNOSIS — X500XXA Overexertion from strenuous movement or load, initial encounter: Secondary | ICD-10-CM | POA: Insufficient documentation

## 2022-07-17 DIAGNOSIS — S8990XA Unspecified injury of unspecified lower leg, initial encounter: Secondary | ICD-10-CM | POA: Diagnosis present

## 2022-07-17 MED ORDER — MELOXICAM 15 MG PO TABS: 15.0000 mg | ORAL_TABLET | Freq: Every day | ORAL | 2 refills | Status: DC

## 2022-07-17 MED ORDER — TETANUS-DIPHTH-ACELL PERTUSSIS 5-2.5-18.5 LF-MCG/0.5 IM SUSY
0.5000 mL | PREFILLED_SYRINGE | Freq: Once | INTRAMUSCULAR | Status: AC
  Administered 2022-07-17: 0.5 mL via INTRAMUSCULAR
  Filled 2022-07-17: qty 0.5

## 2022-07-17 MED ORDER — FENTANYL CITRATE PF 50 MCG/ML IJ SOSY
50.0000 ug | PREFILLED_SYRINGE | Freq: Once | INTRAMUSCULAR | Status: AC
  Administered 2022-07-17: 50 ug via INTRAMUSCULAR
  Filled 2022-07-17: qty 1

## 2022-07-17 MED ORDER — OXYCODONE-ACETAMINOPHEN 5-325 MG PO TABS: 1.0000 | ORAL_TABLET | ORAL | 0 refills | Status: DC | PRN

## 2022-07-17 NOTE — ED Notes (Signed)
WC paperwork completed; pt's boss contacted HR and confirmed for staff that pt is not required to complete a drug/ETOH test today.

## 2022-07-17 NOTE — ED Notes (Signed)
SF, PA at bedside.

## 2022-07-17 NOTE — ED Provider Notes (Signed)
[facility] Provider Note    Event Date/Time   First MD Initiated Contact with Patient 07/17/22 0813     (approximate)   History   Ankle Pain (Left)   HPI  Victor Maldonado is a 32 y.o. male with no significant past medical history presents emergency department after a injury at work today.  Patient states that he was moving a transfer truck tire that has flown in it.  Weighs about 400 to 500 pounds.  Patient states that the tire fell on his leg.  Took a forklift lifted off.  Complaining of pain from the upper tib-fib to the ankle.  States difficult to bear weight.      Physical Exam   Triage Vital Signs: ED Triage Vitals  Enc Vitals Group     BP 07/17/22 0811 (!) 138/94     Pulse Rate 07/17/22 0811 70     Resp 07/17/22 0811 18     Temp --      Temp src --      SpO2 07/17/22 0811 99 %     Weight --      Height --      Head Circumference --      Peak Flow --      Pain Score 07/17/22 0809 7     Pain Loc --      Pain Edu? --      Excl. in [address]? --     Most recent vital signs: Vitals:   07/17/22 0811  BP: (!) 138/94  Pulse: 70  Resp: 18  SpO2: 99%     General: Awake, no distress.   CV:  Good peripheral perfusion. regular rate and  rhythm Resp:  Normal effort. Abd:  No distention.   Other:  Left lower leg with tenderness along the calf and musculature, tender along the distal bone, foot is nontender, neurovascular intact, small abrasion noted to the left ankle   ED Results / Procedures / Treatments   Labs (all labs ordered are listed, but only abnormal results are displayed) Labs Reviewed - No data to display   EKG     RADIOLOGY Left tib-fib, left ankle    PROCEDURES:   .Ortho Injury Treatment  Date/Time: 07/17/2022 8:56 AM  Performed by: Versie Starks, PA-C Authorized by: Versie Starks, PA-C   Consent:    Consent obtained:  Verbal   Consent given by:  Patient   Risks discussed:  Nerve damage, restricted joint  movement, vascular damage, stiffness, recurrent dislocation and irreducible dislocation   Alternatives discussed:  ReferralInjury location: lower leg Location details: left lower leg Injury type: fracture Fracture type: fibular shaft Pre-procedure neurovascular assessment: neurovascularly intact Pre-procedure distal perfusion: normal Pre-procedure neurological function: normal Pre-procedure range of motion: normal  Anesthesia: Local anesthesia used: no  Patient sedated: NoManipulation performed: no Immobilization: splint Splint type: short leg Splint Applied by: ED Tech Supplies used: cotton padding, elastic bandage and Ortho-Glass Post-procedure neurovascular assessment: post-procedure neurovascularly intact Post-procedure distal perfusion: normal Post-procedure neurological function: normal Post-procedure range of motion: normal      MEDICATIONS ORDERED IN ED: Medications  fentaNYL (SUBLIMAZE) injection 50 mcg (50 mcg Intramuscular Given 07/17/22 0833)  Tdap (BOOSTRIX) injection 0.5 mL (0.5 mLs Intramuscular Given 07/17/22 0834)     IMPRESSION / MDM / [place] / ED COURSE  I reviewed the triage vital signs and the nursing notes.  Differential diagnosis includes, but is not limited to, fracture, contusion, crush injury, sprain, compartment syndrome  Patient's presentation is most consistent with acute presentation with potential threat to life or bodily function.   I do not feel that the patient is in compartment syndrome at this time.  Does not have severe amount of swelling.  Neurovascular is intact.  xrays ordered to evaluate for fracture.  Patient was given fentanyl 50 both agreeable to yelp, Tdap was updated due to the abrasion  Ice packs applied to the lower extremity  X-ray of the left tib-fib and left ankle independently reviewed and interpreted by me as having a small fibula fracture  Plan at this time is for pain  control elevate and ice, splint, follow-up with orthopedics.  Splint applied.  Crutches given.  Patient has to follow-up with orthopedics.  Worker's Comp. restrictions provided in the work note.  Desk duty only so that he can elevate the leg.  Strict instructions to return to the emergency department if any signs of compartment syndrome.  Patient is in agreement treatment plan.  Discharged stable condition.     FINAL CLINICAL IMPRESSION(S) / ED DIAGNOSES   Final diagnoses:  Other closed fracture of distal end of left fibula, initial encounter     Rx / DC Orders   ED Discharge Orders          Ordered    meloxicam (MOBIC) 15 MG tablet  Daily        07/17/22 0855    oxyCODONE-acetaminophen (PERCOCET) 5-325 MG tablet  Every 4 hours PRN        07/17/22 0855             Note:  This document was prepared using Dragon voice recognition software and may include unintentional dictation errors.    Versie Starks, PA-C 07/17/22 ID:[id number]    Duffy Bruce, MD 07/17/22 2135

## 2022-07-17 NOTE — ED Notes (Signed)
Imaging personnel at bedside.

## 2022-07-17 NOTE — Discharge Instructions (Addendum)
Elevate and ice the leg is much as possible.  Do not bear weight until evaluated by orthopedics.  You have a small fracture in the fibula.  However, I am  more concerned about the crush injury injuring to your muscles.  If you begin to have a large amount of swelling please return emergency department immediately, if you cannot feel your toes return immediately  When you call HR, let them know you need to see an orthopedist.  If they cover [facility] Dr Posey Pronto is on call and will have ER slots for next week If they cover [facility], Dr Mack Guise is on call and should have appointments for next week also For work, youi cannot do anything other than sit at a desk and elevate your leg until released by orthopedics Take the medications as prescribed, meloxicam for pain and swelling, if this controls the pain do not take the narcotic as narcotics can trigger addiction.  Victor Maldonado

## 2022-07-17 NOTE — ED Triage Notes (Signed)
Pt to Ed via POV from work. Pt reports he had a big tire fall on his left ankle at work. Pt reports pain to left ankle.

## 2022-09-25 DIAGNOSIS — S82402D Unspecified fracture of shaft of left fibula, subsequent encounter for closed fracture with routine healing: Secondary | ICD-10-CM | POA: Diagnosis not present

## 2022-09-25 DIAGNOSIS — S82832A Other fracture of upper and lower end of left fibula, initial encounter for closed fracture: Secondary | ICD-10-CM | POA: Diagnosis not present

## 2022-10-27 ENCOUNTER — Other Ambulatory Visit: Payer: Self-pay

## 2022-10-27 ENCOUNTER — Encounter: Payer: Self-pay | Admitting: Nurse Practitioner

## 2022-10-27 ENCOUNTER — Ambulatory Visit (INDEPENDENT_AMBULATORY_CARE_PROVIDER_SITE_OTHER): Payer: Medicaid Other | Admitting: Nurse Practitioner

## 2022-10-27 VITALS — BP 110/78 | HR 73 | Temp 98.1°F | Resp 18 | Ht 76.0 in | Wt 173.9 lb

## 2022-10-27 DIAGNOSIS — Z7689 Persons encountering health services in other specified circumstances: Secondary | ICD-10-CM

## 2022-10-27 DIAGNOSIS — Z1159 Encounter for screening for other viral diseases: Secondary | ICD-10-CM

## 2022-10-27 DIAGNOSIS — Z130 Encounter for screening for diseases of the blood and blood-forming organs and certain disorders involving the immune mechanism: Secondary | ICD-10-CM

## 2022-10-27 DIAGNOSIS — Z13220 Encounter for screening for lipoid disorders: Secondary | ICD-10-CM | POA: Diagnosis not present

## 2022-10-27 DIAGNOSIS — Z114 Encounter for screening for human immunodeficiency virus [HIV]: Secondary | ICD-10-CM | POA: Diagnosis not present

## 2022-10-27 DIAGNOSIS — Z131 Encounter for screening for diabetes mellitus: Secondary | ICD-10-CM | POA: Diagnosis not present

## 2022-10-27 DIAGNOSIS — H6506 Acute serous otitis media, recurrent, bilateral: Secondary | ICD-10-CM

## 2022-10-27 MED ORDER — AMOXICILLIN 875 MG PO TABS
875.0000 mg | ORAL_TABLET | Freq: Two times a day (BID) | ORAL | 0 refills | Status: AC
Start: 2022-10-27 — End: 2022-11-03

## 2022-10-27 NOTE — Progress Notes (Signed)
BP 110/78   Pulse 73   Temp 98.1 F (36.7 C) (Oral)   Resp 18   Ht 6\' 4"  (1.93 m)   Wt 173 lb 14.4 oz (78.9 kg)   SpO2 99%   BMI 21.17 kg/m    Subjective:    Patient ID: Victor Maldonado, male    DOB: 26-Jun-1990, 32 y.o.   MRN: [id number]  HPI: Victor Maldonado is a 32 y.o. male  Chief Complaint  Patient presents with   Establish Care   Ear Pain    Since flying to [place]   Establish care: his last physical was unknown.  Medical history includes none.  Family history includes none.  Health Maintenance due for labs.   Bilateral Ear pain: he reports he has had bilateral ear pain since flying to [place].  He says he flew a year ago.  He says that his ears hurt off and on for a year.  Upon exam bilateral TMs are inflamed and bulging.  Will start amoxicillin.    Relevant past medical, surgical, family and social history reviewed and updated as indicated. Interim medical history since our last visit reviewed. Allergies and medications reviewed and updated.  Review of Systems  Constitutional: Negative for fever or weight change.  HEENT: positive for ear pain Respiratory: Negative for cough and shortness of breath.   Cardiovascular: Negative for chest pain or palpitations.  Gastrointestinal: Negative for abdominal pain, no bowel changes.  Musculoskeletal: Negative for gait problem or joint swelling.  Skin: Negative for rash.  Neurological: Negative for dizziness or headache.  No other specific complaints in a complete review of systems (except as listed in HPI above).      Objective:    BP 110/78   Pulse 73   Temp 98.1 F (36.7 C) (Oral)   Resp 18   Ht 6\' 4"  (1.93 m)   Wt 173 lb 14.4 oz (78.9 kg)   SpO2 99%   BMI 21.17 kg/m   Wt Readings from Last 3 Encounters:  10/27/22 173 lb 14.4 oz (78.9 kg)  03/10/15 155 lb (70.3 kg)    Physical Exam  Constitutional: Patient appears well-developed and well-nourished.  No distress.  HEENT: head atraumatic,  normocephalic, pupils equal and reactive to light, ears TMs inflamed and bulging, neck supple, throat within normal limits Cardiovascular: Normal rate, regular rhythm and normal heart sounds.  No murmur heard. No BLE edema. Pulmonary/Chest: Effort normal and breath sounds normal. No respiratory distress. Abdominal: Soft.  There is no tenderness. Psychiatric: Patient has a normal mood and affect. behavior is normal. Judgment and thought content normal.  Results for orders placed or performed during the hospital encounter of 03/10/15  Basic metabolic panel  Result Value Ref Range   Sodium 142 135 - 145 mmol/L   Potassium 3.5 3.5 - 5.1 mmol/L   Chloride 106 101 - 111 mmol/L   CO2 27 22 - 32 mmol/L   Glucose, Bld 98 65 - 99 mg/dL   BUN 15 6 - 20 mg/dL   Creatinine, Ser 4.09 (H) 0.61 - 1.24 mg/dL   Calcium 9.4 8.9 - 81.1 mg/dL   GFR calc non Af Amer >60 >60 mL/min   GFR calc Af Amer >60 >60 mL/min   Anion gap 9 5 - 15  CBC  Result Value Ref Range   WBC 10.7 (H) 3.8 - 10.6 K/uL   RBC 4.78 4.40 - 5.90 MIL/uL   Hemoglobin 14.3 13.0 - 18.0 g/dL   HCT  42.2 40.0 - 52.0 %   MCV 88.3 80.0 - 100.0 fL   MCH 29.9 26.0 - 34.0 pg   MCHC 33.8 32.0 - 36.0 g/dL   RDW 09.8 11.9 - 14.7 %   Platelets 199 150 - 440 K/uL  Troponin I  Result Value Ref Range   Troponin I <0.03 <0.031 ng/mL  Urine Drug Screen, Qualitative ([facility] only)  Result Value Ref Range   Tricyclic, Ur Screen NONE DETECTED NONE DETECTED   Amphetamines, Ur Screen NONE DETECTED NONE DETECTED   MDMA (Ecstasy)Ur Screen NONE DETECTED NONE DETECTED   Cocaine Metabolite,Ur [place] NONE DETECTED NONE DETECTED   Opiate, Ur Screen NONE DETECTED NONE DETECTED   Phencyclidine (PCP) Ur S NONE DETECTED NONE DETECTED   Cannabinoid 50 Ng, Ur [place] POSITIVE (A) NONE DETECTED   Barbiturates, Ur Screen NONE DETECTED NONE DETECTED   Benzodiazepine, Ur Scrn NONE DETECTED NONE DETECTED   Methadone Scn, Ur NONE DETECTED NONE DETECTED  Urinalysis complete, with  microscopic ([facility] only)  Result Value Ref Range   Color, Urine YELLOW (A) YELLOW   APPearance CLEAR (A) CLEAR   Glucose, UA NEGATIVE NEGATIVE mg/dL   Bilirubin Urine NEGATIVE NEGATIVE   Ketones, ur TRACE (A) NEGATIVE mg/dL   Specific Gravity, Urine 1.023 1.005 - 1.030   Hgb urine dipstick NEGATIVE NEGATIVE   pH 6.0 5.0 - 8.0   Protein, ur NEGATIVE NEGATIVE mg/dL   Nitrite NEGATIVE NEGATIVE   Leukocytes, UA NEGATIVE NEGATIVE   RBC / HPF 0-5 0 - 5 RBC/hpf   WBC, UA 0-5 0 - 5 WBC/hpf   Bacteria, UA NONE SEEN NONE SEEN   Squamous Epithelial / HPF NONE SEEN NONE SEEN   Mucus PRESENT       Assessment & Plan:   Problem List Items Addressed This Visit   None Visit Diagnoses     Recurrent acute serous otitis media of both ears    -  Primary   start amoxicillin, can also start allergy pill zyrtec, claritin   Relevant Medications   amoxicillin (AMOXIL) 875 MG tablet   Encounter to establish care       schedule cpe   Screening for diabetes mellitus       Relevant Orders   COMPLETE METABOLIC PANEL WITH GFR   Hemoglobin A1c   Screening for deficiency anemia       Relevant Orders   CBC with Differential/Platelet   Screening for cholesterol level       Relevant Orders   Lipid panel   Screening for HIV without presence of risk factors       Relevant Orders   HIV Antibody (routine testing w rflx)   Encounter for hepatitis C screening test for low risk patient       Relevant Orders   Hepatitis C antibody        Follow up plan: Return in about 6 months (around 04/29/2023) for cpe.

## 2023-10-18 DIAGNOSIS — Z113 Encounter for screening for infections with a predominantly sexual mode of transmission: Secondary | ICD-10-CM | POA: Diagnosis not present

## 2023-10-18 DIAGNOSIS — A64 Unspecified sexually transmitted disease: Secondary | ICD-10-CM | POA: Diagnosis not present

## 2023-10-22 ENCOUNTER — Telehealth: Payer: Self-pay | Admitting: Nurse Practitioner

## 2023-10-22 NOTE — Telephone Encounter (Signed)
 Left vm we are able to do testing, will need to schedule an appt

## 2023-10-22 NOTE — Telephone Encounter (Signed)
 Do we offer these test?

## 2023-10-22 NOTE — Telephone Encounter (Signed)
 Copied from CRM [id number]. Topic: Clinical - Request for Lab/Test Order >> Oct 18, 2023  1:44 PM Powell HERO wrote: Reason for CRM: Patient wants to be tested for mycoplasma and uroplasma, and also wants STD testing completed. Please advise patient on where he can get these done if they are not offered in the office.

## 2023-12-19 DIAGNOSIS — R051 Acute cough: Secondary | ICD-10-CM | POA: Diagnosis not present

## 2023-12-19 DIAGNOSIS — B349 Viral infection, unspecified: Secondary | ICD-10-CM | POA: Diagnosis not present

## 2023-12-19 DIAGNOSIS — U071 COVID-19: Secondary | ICD-10-CM | POA: Diagnosis not present

## 2023-12-25 DIAGNOSIS — J40 Bronchitis, not specified as acute or chronic: Secondary | ICD-10-CM | POA: Diagnosis not present

## 2023-12-25 DIAGNOSIS — R0602 Shortness of breath: Secondary | ICD-10-CM | POA: Diagnosis not present

## 2024-02-17 DIAGNOSIS — H00012 Hordeolum externum right lower eyelid: Secondary | ICD-10-CM | POA: Diagnosis not present

## 2024-02-17 DIAGNOSIS — S0501XA Injury of conjunctiva and corneal abrasion without foreign body, right eye, initial encounter: Secondary | ICD-10-CM | POA: Diagnosis not present
